# Patient Record
Sex: Male | Born: 1965 | Race: White | Hispanic: No | Marital: Married | Smoking: Never smoker
Health system: Southern US, Community
[De-identification: ages and names within clinical notes are randomized; demographics above are authoritative.]

## PROBLEM LIST (undated history)

## (undated) ENCOUNTER — Emergency Department (HOSPITAL_COMMUNITY): Admission: EM | Source: Home / Self Care

---

## 2004-11-29 ENCOUNTER — Emergency Department: Payer: Self-pay | Admitting: General Practice

## 2005-01-27 ENCOUNTER — Emergency Department: Payer: Self-pay | Admitting: Emergency Medicine

## 2007-05-26 IMAGING — CT CT STONE STUDY
1 of 2 series · 16 of 32 positions shown, 20 images · non-contrast
Comparison: none

REASON FOR EXAM: Left flank pain  rm 17
COMMENTS:

[Series 2: stone · axial · 0.72mm/px · z∈[-508,-52]mm · 16 of 164 slices shown, 20 images]
[im 6/164  soft-tissue]
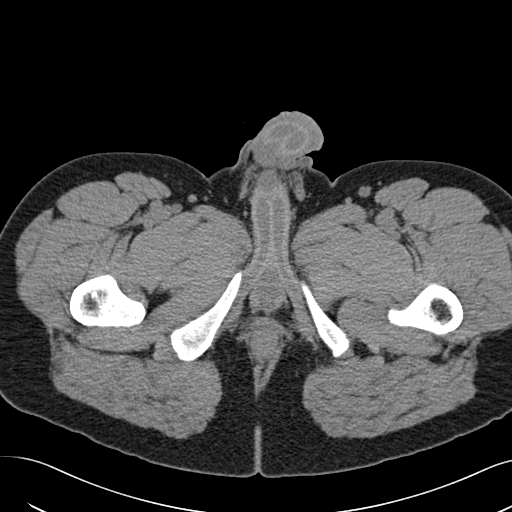
[im 6/164  bone]
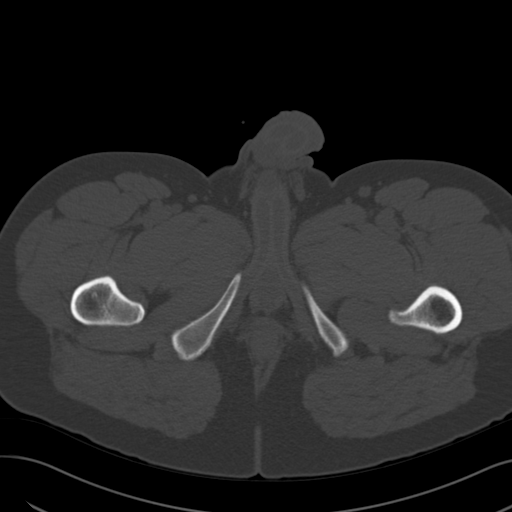
[im 18/164  soft-tissue]
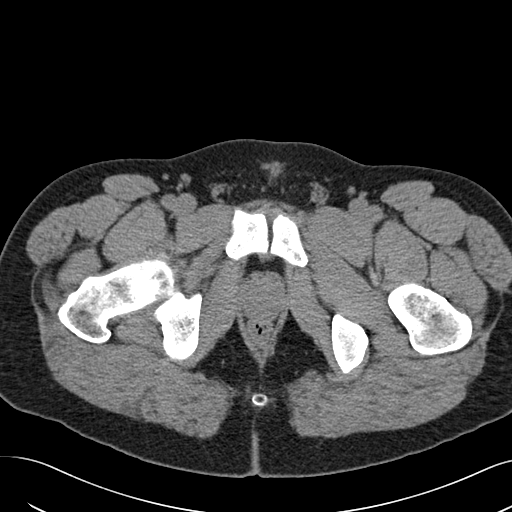
[im 30/164  soft-tissue]
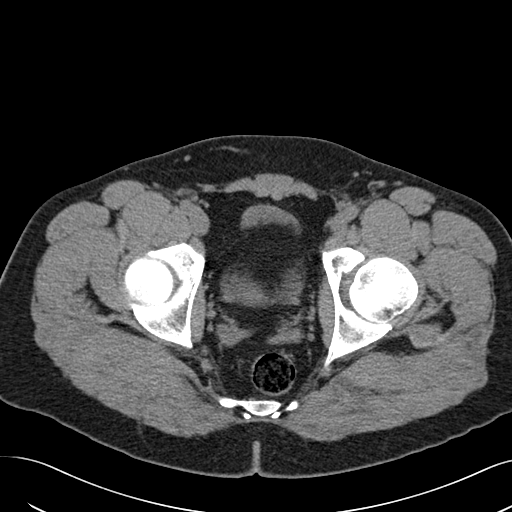
[im 41/164  soft-tissue]
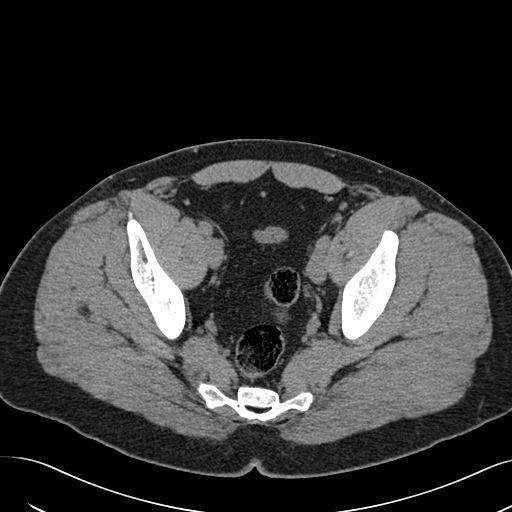
[im 53/164  soft-tissue]
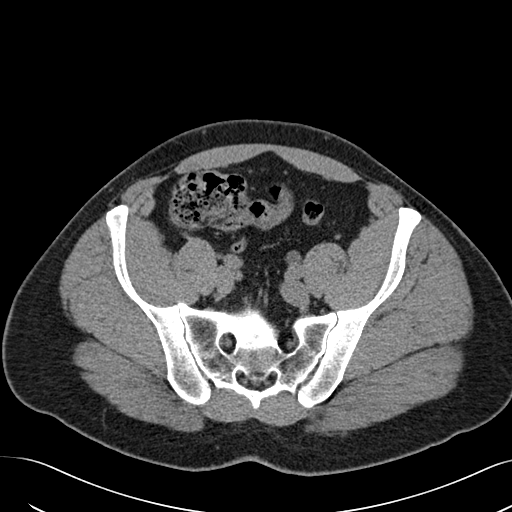
[im 65/164  soft-tissue]
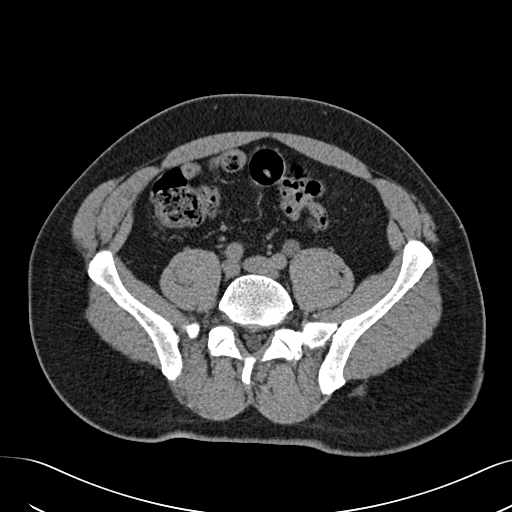
[im 76/164  soft-tissue]
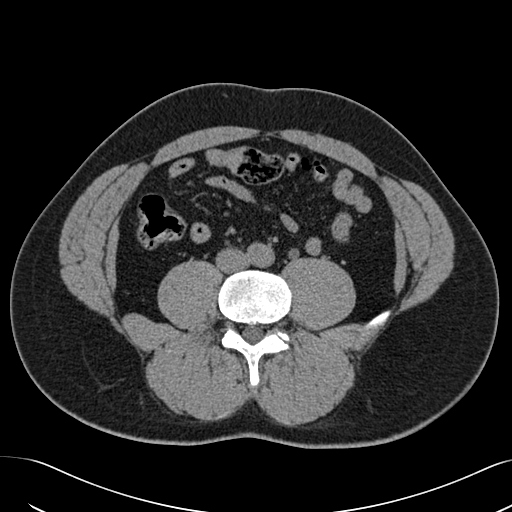
[im 88/164  soft-tissue]
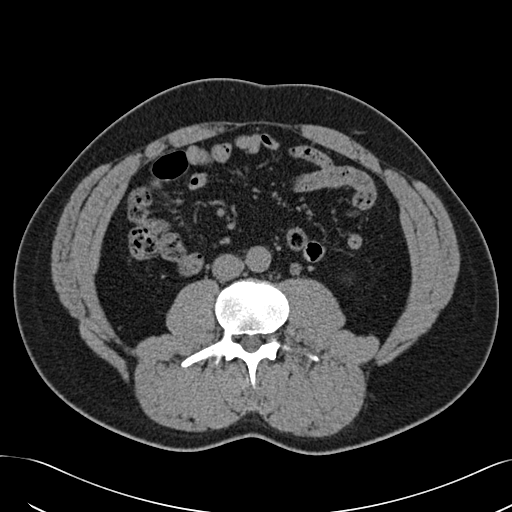
[im 99/164  soft-tissue]
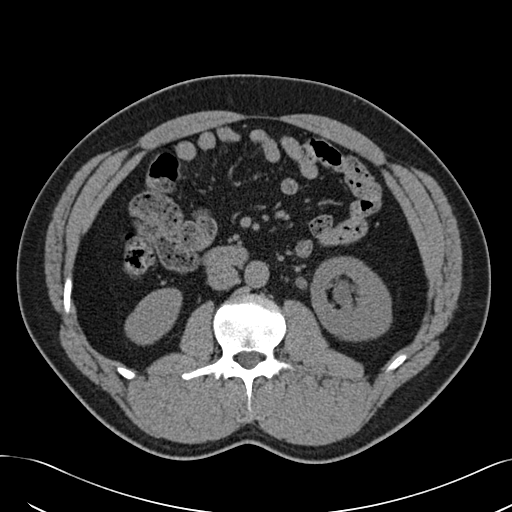
[im 99/164  bone]
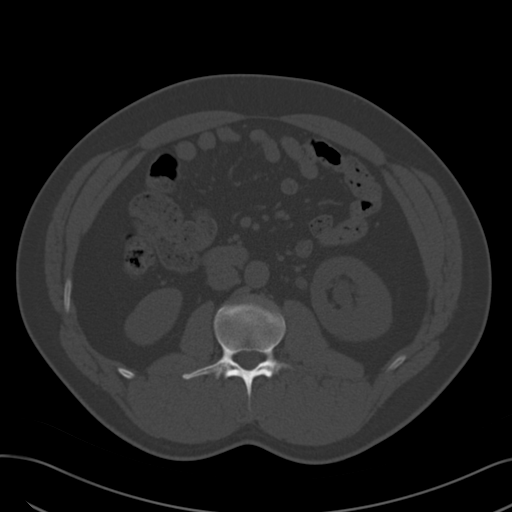
[im 111/164  soft-tissue]
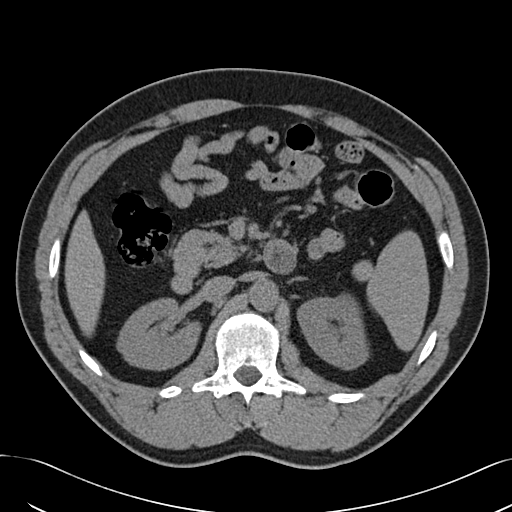
[im 123/164  soft-tissue]
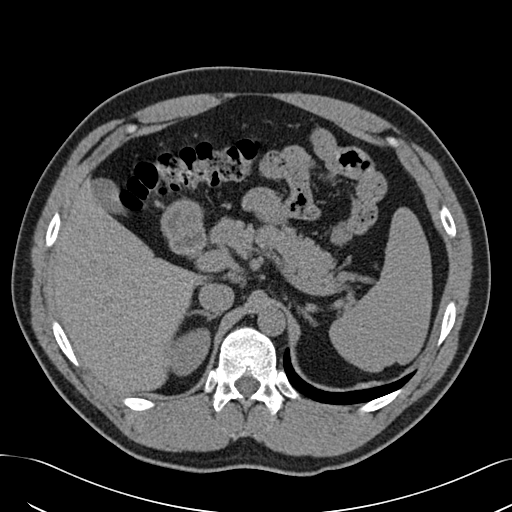
[im 134/164  soft-tissue]
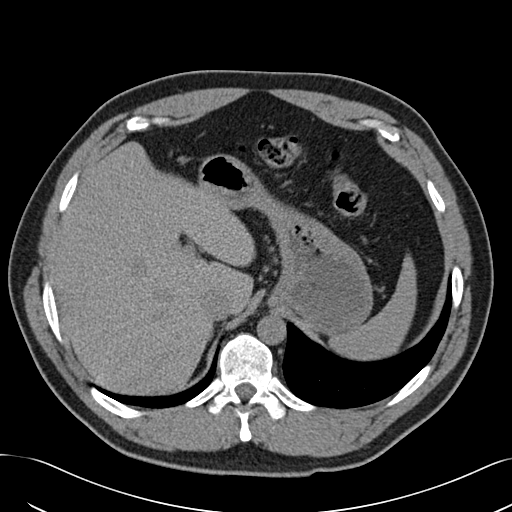
[im 140/164  lung]
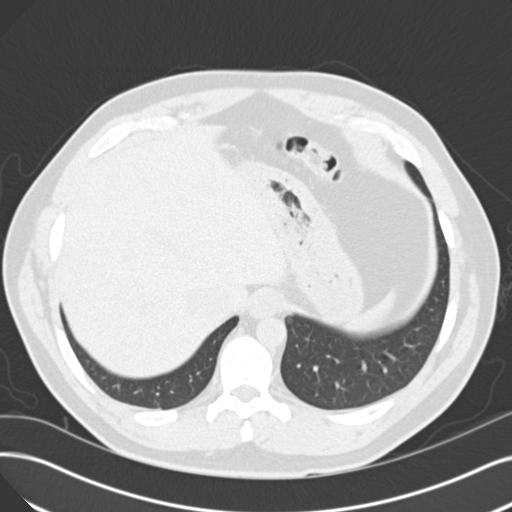
[im 146/164  soft-tissue]
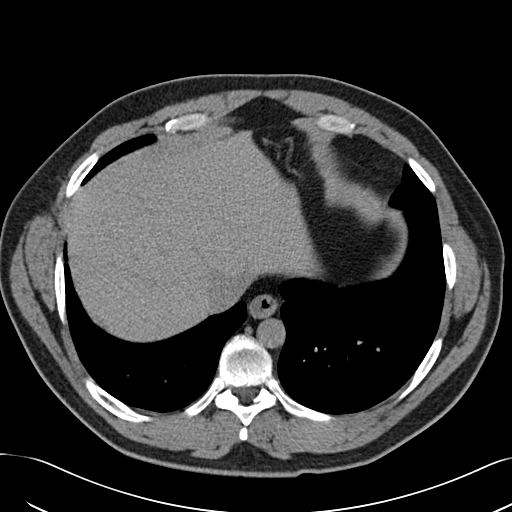
[im 146/164  lung]
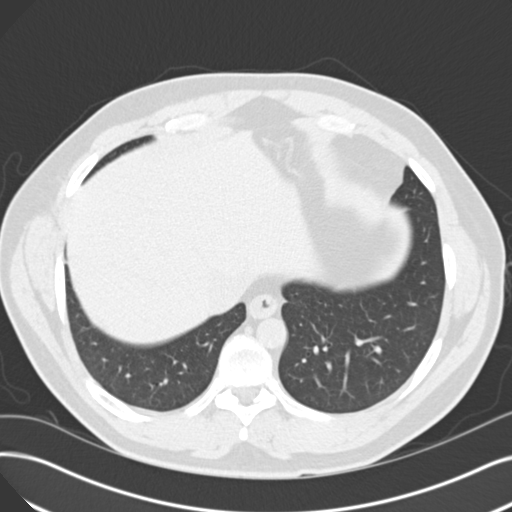
[im 152/164  lung]
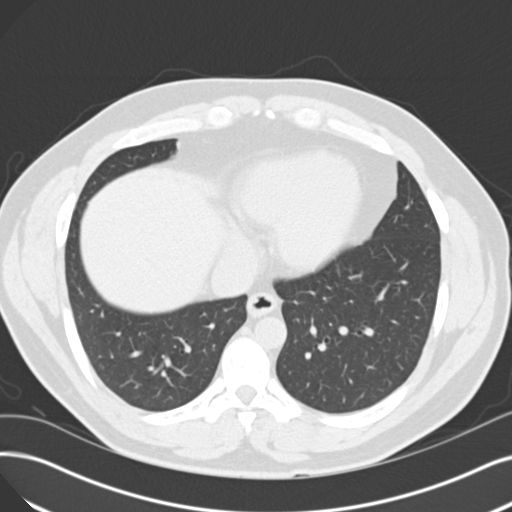
[im 158/164  soft-tissue]
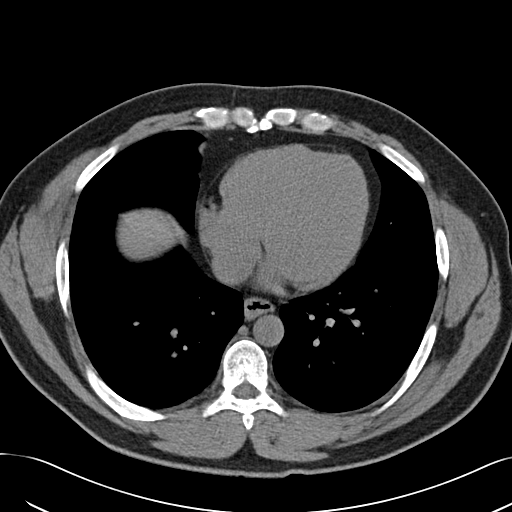
[im 158/164  lung]
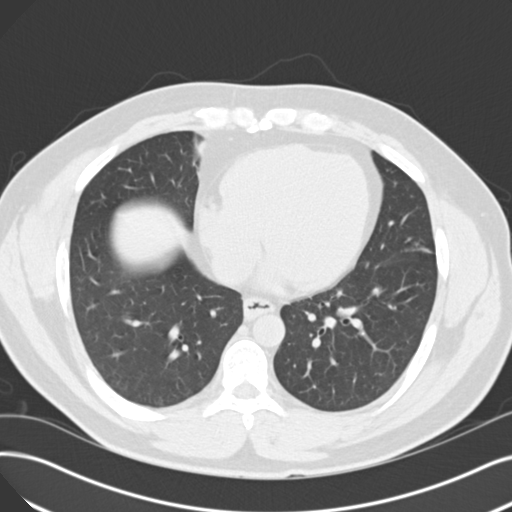

[16 of 32 positions shown; findings below may reference images not displayed]

PROCEDURE:     CT  - CT ABDOMEN /PELVIS WO (STONE)  - January 27, 2005  [DATE]

RESULT:       Unenhanced emergent CT was performed for LEFT flank pain.
Exam was originally read by the [HOSPITAL].  There is noted approximately
3 mm calculus in the distal LEFT ureter with associated dilatation of the
ureter and upper LEFT collecting system.  No renal calculi are noted in the
RIGHT or LEFT kidney. No free fluid is noted in the abdomen or pelvis.  The
images through the lung bases reveal the lung bases to be clear with no
effusion.
IMPRESSION: Findings which can be compatible with an approximately 3 mm calculus in the
distal LEFT ureter with associated mild LEFT hydronephrosis.

## 2015-04-22 ENCOUNTER — Encounter: Payer: Self-pay | Admitting: Emergency Medicine

## 2015-04-22 ENCOUNTER — Emergency Department
Admission: EM | Admit: 2015-04-22 | Discharge: 2015-04-22 | Disposition: A | Discharge status: Home / Self Care | Attending: Emergency Medicine | Admitting: Emergency Medicine

## 2015-04-22 DIAGNOSIS — Y999 Unspecified external cause status: Secondary | ICD-10-CM | POA: Diagnosis not present

## 2015-04-22 DIAGNOSIS — W268XXA Contact with other sharp object(s), not elsewhere classified, initial encounter: Secondary | ICD-10-CM | POA: Diagnosis not present

## 2015-04-22 DIAGNOSIS — Y9389 Activity, other specified: Secondary | ICD-10-CM | POA: Insufficient documentation

## 2015-04-22 DIAGNOSIS — Z23 Encounter for immunization: Secondary | ICD-10-CM | POA: Diagnosis not present

## 2015-04-22 DIAGNOSIS — S61210A Laceration without foreign body of right index finger without damage to nail, initial encounter: Secondary | ICD-10-CM | POA: Insufficient documentation

## 2015-04-22 DIAGNOSIS — Y929 Unspecified place or not applicable: Secondary | ICD-10-CM | POA: Insufficient documentation

## 2015-04-22 DIAGNOSIS — S61219A Laceration without foreign body of unspecified finger without damage to nail, initial encounter: Secondary | ICD-10-CM

## 2015-04-22 MED ORDER — LIDOCAINE HCL (PF) 1 % IJ SOLN
10.0000 mL | Freq: Once | INTRAMUSCULAR | Status: DC
  Filled 2015-04-22: qty 10

## 2015-04-22 MED ORDER — DIPHENHYDRAMINE HCL 25 MG PO CAPS
ORAL_CAPSULE | ORAL | Status: AC
  Filled 2015-04-22: qty 1

## 2015-04-22 MED ORDER — TETANUS-DIPHTH-ACELL PERTUSSIS 5-2.5-18.5 LF-MCG/0.5 IM SUSP
0.5000 mL | Freq: Once | INTRAMUSCULAR | Status: AC
  Administered 2015-04-22: 0.5 mL via INTRAMUSCULAR
  Filled 2015-04-22: qty 0.5

## 2015-04-22 NOTE — ED Notes (Signed)
Patient presents to the ED with laceration to his right index finger.  Patient states he was installing an oven fan and patient states he cut his finger with the metal casing around an oven fan.  Laceration appears to be about 1 inch long and edges are approximated.  Patient is in no obvious distress at this time.

## 2015-04-22 NOTE — Discharge Instructions (Signed)
Laceration Care, Adult °A laceration is a cut that goes through all of the layers of the skin and into the tissue that is right under the skin. Some lacerations heal on their own. Others need to be closed with stitches (sutures), staples, skin adhesive strips, or skin glue. Proper laceration care minimizes the risk of infection and helps the laceration to heal better. °HOW TO CARE FOR YOUR LACERATION °If sutures or staples were used: °· Keep the wound clean and dry. °· If you were given a bandage (dressing), you should change it at least one time per day or as told by your health care provider. You should also change it if it becomes wet or dirty. °· Keep the wound completely dry for the first 24 hours or as told by your health care provider. After that time, you may shower or bathe. However, make sure that the wound is not soaked in water until after the sutures or staples have been removed. °· Clean the wound one time each day or as told by your health care provider: °· Wash the wound with soap and water. °· Rinse the wound with water to remove all soap. °· Pat the wound dry with a clean towel. Do not rub the wound. °· After cleaning the wound, apply a thin layer of antibiotic ointment as told by your health care provider. This will help to prevent infection and keep the dressing from sticking to the wound. °· Have the sutures or staples removed as told by your health care provider. °If skin adhesive strips were used: °· Keep the wound clean and dry. °· If you were given a bandage (dressing), you should change it at least one time per day or as told by your health care provider. You should also change it if it becomes dirty or wet. °· Do not get the skin adhesive strips wet. You may shower or bathe, but be careful to keep the wound dry. °· If the wound gets wet, pat it dry with a clean towel. Do not rub the wound. °· Skin adhesive strips fall off on their own. You may trim the strips as the wound heals. Do not  remove skin adhesive strips that are still stuck to the wound. They will fall off in time. °If skin glue was used: °· Try to keep the wound dry, but you may briefly wet it in the shower or bath. Do not soak the wound in water, such as by swimming. °· After you have showered or bathed, gently pat the wound dry with a clean towel. Do not rub the wound. °· Do not do any activities that will make you sweat heavily until the skin glue has fallen off on its own. °· Do not apply liquid, cream, or ointment medicine to the wound while the skin glue is in place. Using those may loosen the film before the wound has healed. °· If you were given a bandage (dressing), you should change it at least one time per day or as told by your health care provider. You should also change it if it becomes dirty or wet. °· If a dressing is placed over the wound, be careful not to apply tape directly over the skin glue. Doing that may cause the glue to be pulled off before the wound has healed. °· Do not pick at the glue. The skin glue usually remains in place for 5-10 days, then it falls off of the skin. °General Instructions °· Take over-the-counter and prescription   medicines only as told by your health care provider. °· If you were prescribed an antibiotic medicine or ointment, take or apply it as told by your doctor. Do not stop using it even if your condition improves. °· To help prevent scarring, make sure to cover your wound with sunscreen whenever you are outside after stitches are removed, after adhesive strips are removed, or when glue remains in place and the wound is healed. Make sure to wear a sunscreen of at least 30 SPF. °· Do not scratch or pick at the wound. °· Keep all follow-up visits as told by your health care provider. This is important. °· Check your wound every day for signs of infection. Watch for: °· Redness, swelling, or pain. °· Fluid, blood, or pus. °· Raise (elevate) the injured area above the level of your heart  while you are sitting or lying down, if possible. °SEEK MEDICAL CARE IF: °· You received a tetanus shot and you have swelling, severe pain, redness, or bleeding at the injection site. °· You have a fever. °· A wound that was closed breaks open. °· You notice a bad smell coming from your wound or your dressing. °· You notice something coming out of the wound, such as wood or glass. °· Your pain is not controlled with medicine. °· You have increased redness, swelling, or pain at the site of your wound. °· You have fluid, blood, or pus coming from your wound. °· You notice a change in the color of your skin near your wound. °· You need to change the dressing frequently due to fluid, blood, or pus draining from the wound. °· You develop a new rash. °· You develop numbness around the wound. °SEEK IMMEDIATE MEDICAL CARE IF: °· You develop severe swelling around the wound. °· Your pain suddenly increases and is severe. °· You develop painful lumps near the wound or on skin that is anywhere on your body. °· You have a red streak going away from your wound. °· The wound is on your hand or foot and you cannot properly move a finger or toe. °· The wound is on your hand or foot and you notice that your fingers or toes look pale or bluish. °  °This information is not intended to replace advice given to you by your health care provider. Make sure you discuss any questions you have with your health care provider. °  °Document Released: 12/29/2004 Document Revised: 05/15/2014 Document Reviewed: 12/25/2013 °Elsevier Interactive Patient Education ©2016 [employer] ° °Stitches, Staples, or Adhesive Wound Closure °Health care providers use stitches (sutures), staples, and certain glue (skin adhesives) to hold skin together while it heals (wound closure). You may need this treatment after you have surgery or if you cut your skin accidentally. These methods help your skin to heal more quickly and make it less likely that you will have  a scar. A wound may take several months to heal completely. °The type of wound you have determines when your wound gets closed. In most cases, the wound is closed as soon as possible (primary skin closure). Sometimes, closure is delayed so the wound can be cleaned and allowed to heal naturally. This reduces the chance of infection. Delayed closure may be needed if your wound: °· Is caused by a bite. °· Happened more than 6 hours ago. °· Involves loss of skin or the tissues under the skin. °· Has dirt or debris in it that cannot be removed. °· Is infected. °WHAT   ARE THE DIFFERENT KINDS OF WOUND CLOSURES? °There are many options for wound closure. The one that your health care provider uses depends on how deep and how large your wound is. °Adhesive Glue °To use this type of glue to close a wound, your health care provider holds the edges of the wound together and paints the glue on the surface of your skin. You may need more than one layer of glue. Then the wound may be covered with a light bandage (dressing). °This type of skin closure may be used for small wounds that are not deep (superficial). Using glue for wound closure is less painful than other methods. It does not require a medicine that numbs the area (local anesthetic). This method also leaves nothing to be removed. Adhesive glue is often used for children and on facial wounds. °Adhesive glue cannot be used for wounds that are deep, uneven, or bleeding. It is not used inside of a wound.  °Adhesive Strips °These strips are made of sticky (adhesive), porous paper. They are applied across your skin edges like a regular adhesive bandage. You leave them on until they fall off. °Adhesive strips may be used to close very superficial wounds. They may also be used along with sutures to improve the closure of your skin edges.  °Sutures °Sutures are the oldest method of wound closure. Sutures can be made from natural substances, such as silk, or from synthetic  materials, such as nylon and steel. They can be made from a material that your body can break down as your wound heals (absorbable), or they can be made from a material that needs to be removed from your skin (nonabsorbable). They come in many different strengths and sizes. °Your health care provider attaches the sutures to a steel needle on one end. Sutures can be passed through your skin, or through the tissues beneath your skin. Then they are tied and cut. Your skin edges may be closed in one continuous stitch or in separate stitches. °Sutures are strong and can be used for all kinds of wounds. Absorbable sutures may be used to close tissues under the skin. The disadvantage of sutures is that they may cause skin reactions that lead to infection. Nonabsorbable sutures need to be removed. °Staples °When surgical staples are used to close a wound, the edges of your skin on both sides of the wound are brought close together. A staple is placed across the wound, and an instrument secures the edges together. Staples are often used to close surgical cuts (incisions). °Staples are faster to use than sutures, and they cause less skin reaction. Staples need to be removed using a tool that bends the staples away from your skin. °HOW DO I CARE FOR MY WOUND CLOSURE? °· Take medicines only as directed by your health care provider. °· If you were prescribed an antibiotic medicine for your wound, finish it all even if you start to feel better. °· Use ointments or creams only as directed by your health care provider. °· Wash your hands with soap and water before and after touching your wound. °· Do not soak your wound in water. Do not take baths, swim, or use a hot tub until your health care provider approves. °· Ask your health care provider when you can start showering. Cover your wound if directed by your health care provider. °· Do not take out your own sutures or staples. °· Do not pick at your wound. Picking can cause an  infection. °·   Keep all follow-up visits as directed by your health care provider. This is important. °HOW LONG WILL I HAVE MY WOUND CLOSURE? °· Leave adhesive glue on your skin until the glue peels away. °· Leave adhesive strips on your skin until the strips fall off. °· Absorbable sutures will dissolve within several days. °· Nonabsorbable sutures and staples must be removed. The location of the wound will determine how long they stay in. This can range from several days to a couple of weeks. °WHEN SHOULD I SEEK HELP FOR MY WOUND CLOSURE? °Contact your health care provider if: °· You have a fever. °· You have chills. °· You have drainage, redness, swelling, or pain at your wound. °· There is a bad smell coming from your wound. °· The skin edges of your wound start to separate after your sutures have been removed. °· Your wound becomes thick, raised, and darker in color after your sutures come out (scarring). °  °This information is not intended to replace advice given to you by your health care provider. Make sure you discuss any questions you have with your health care provider. °  °Document Released: 09/23/2000 Document Revised: 01/19/2014 Document Reviewed: 06/07/2013 °Elsevier Interactive Patient Education ©2016 [employer] ° °

## 2015-04-22 NOTE — ED Notes (Signed)
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 Slipped and cut his right index finger

## 2015-04-22 NOTE — ED Notes (Signed)
AAOx3.  Skin warm and dry.  NAD 

## 2015-04-22 NOTE — ED Provider Notes (Signed)
[facility] Emergency Department Provider Note  ____________________________________________  Time seen: Approximately 6:59 PM  I have reviewed the triage vital signs and the nursing notes.   HISTORY  Chief Complaint Extremity Laceration    HPI Lucas Baird is a 50 y.o. male who presents emergency department complaining of a laceration to the second digit of his right hand. Patient states that he was installing a fan into the oven when he accidentally cut his finger on the head to the blade. Patient states that he has bleeding controlled with direct pressure. 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 Patient denies any numbness or tingling distal to his injury site.   History reviewed. No pertinent past medical history.  There are no active problems to display for this patient.   History reviewed. No pertinent past surgical history.  No current outpatient prescriptions on file.  Allergies Review of patient's allergies indicates no known allergies.  No family history on file.  Social History Social History  Substance Use Topics  . Smoking status: Never Smoker   . Smokeless tobacco: None  . Alcohol Use: No     Review of Systems  Musculoskeletal: No finger or hand pain to the right upper extremity. Skin: Negative for rash. Positive for laceration to the second digit right hand. Neurological: Negative for headaches, focal weakness or numbness. 10-point ROS otherwise negative.  ____________________________________________   PHYSICAL EXAM:  VITAL SIGNS: ED Triage Vitals  Enc Vitals Group     BP 04/22/15 1834 136/83 mmHg     Pulse Rate 04/22/15 1834 71     Resp 04/22/15 1834 20     Temp 04/22/15 1834 98.5 F (36.9 C)     Temp Source 04/22/15 1834 Oral     SpO2 04/22/15 1834 98 %     Weight 04/22/15 1824 204 lb (92.534 kg)     Height  04/22/15 1824  (1.727 m)     Head Cir --      Peak Flow --      Pain Score 04/22/15 1824 5     Pain Loc --      Pain Edu? --      Excl. in GC? --      Constitutional: Alert and oriented. Well appearing and in no acute distress. Cardiovascular: Normal rate, regular rhythm. Normal S1 and S2.  Good peripheral circulation. Respiratory: Normal respiratory effort without tachypnea or retractions. Lungs CTAB. Musculoskeletal:  Full range of motion to right hand and all digits right hand. Cap refill intact 5 digits. Sensation intact 5 digits. Neurologic:  Normal speech and language. No gross focal neurologic deficits are appreciated.  Skin:  Skin is warm, dry and intact. No rash noted. 3 cm laceration noted to the second digit right hand. No bleeding at this time. Edges are smooth. No visible foreign body. Psychiatric: Mood and affect are normal. Speech and behavior are normal. Patient exhibits appropriate insight and judgement.   ____________________________________________   LABS (all labs ordered are listed, but only abnormal results are displayed)  Labs Reviewed - No data to display ____________________________________________  EKG   ____________________________________________  RADIOLOGY   No results found.  ____________________________________________    PROCEDURES  Procedure(s) performed:   LACERATION REPAIR Performed by: Racheal Patches Authorized by: Delorise Royals Torris House Consent: Verbal consent obtained. Risks and benefits: risks, benefits and alternatives were discussed Consent given  by: patient Patient identity confirmed: provided demographic data Prepped and Draped in normal sterile fashion Wound explored  Laceration Location: Second digit right hand  Laceration Length: 3 cm  No Foreign Bodies seen or palpated  Anesthesia: Digital block   Local anesthetic: lidocaine 1 % without epinephrine  Anesthetic total: 5 ml  Irrigation method:  syringe Amount of cleaning: standard  Skin closure: 4-0 Ethilon sutures   Number of sutures: 6   Technique: Simple interrupted   Patient tolerance: Patient tolerated the procedure well with no immediate complications.     Medications  lidocaine (PF) (XYLOCAINE) 1 % injection 10 mL (not administered)  Tdap (BOOSTRIX) injection 0.5 mL (0.5 mLs Intramuscular Given 04/22/15 1910)     ____________________________________________   INITIAL IMPRESSION / ASSESSMENT AND PLAN / ED COURSE  Pertinent labs & imaging results that were available during my care of the patient were reviewed by me and considered in my medical decision making (see chart for details).  Patient's diagnosis is consistent with finger laceration to the second digit right hand. This is closed as described above..  [place]. Patient is given ED precautions to return to the ED for any worsening or new symptoms.     ____________________________________________  FINAL CLINICAL IMPRESSION(S) / ED DIAGNOSES  Final diagnoses:  Finger laceration, initial encounter      NEW MEDICATIONS STARTED DURING THIS VISIT:  New Prescriptions   No medications on file        This chart was dictated using voice recognition software/Dragon. Despite best efforts to proofread, errors can occur which can change the meaning. Any change was purely unintentional.    Racheal PatchesJonathan D Stoy Fenn, PA-C 04/22/15 1934  Phineas SemenGraydon Goodman, MD 04/22/15 2044

## 2017-07-26 ENCOUNTER — Encounter: Payer: Self-pay | Admitting: Emergency Medicine

## 2017-07-26 ENCOUNTER — Emergency Department
Admission: EM | Admit: 2017-07-26 | Discharge: 2017-07-26 | Disposition: A | Discharge status: Home / Self Care | Attending: Emergency Medicine | Admitting: Emergency Medicine

## 2017-07-26 ENCOUNTER — Other Ambulatory Visit: Payer: Self-pay

## 2017-07-26 ENCOUNTER — Emergency Department

## 2017-07-26 DIAGNOSIS — K529 Noninfective gastroenteritis and colitis, unspecified: Secondary | ICD-10-CM | POA: Diagnosis not present

## 2017-07-26 DIAGNOSIS — R112 Nausea with vomiting, unspecified: Secondary | ICD-10-CM | POA: Diagnosis present

## 2017-07-26 LAB — CBC
HCT: 44.9 % (ref 40.0–52.0)
Hemoglobin: 15.4 g/dL (ref 13.0–18.0)
MCH: 30.2 pg (ref 26.0–34.0)
MCHC: 34.4 g/dL (ref 32.0–36.0)
MCV: 87.8 fL (ref 80.0–100.0)
PLATELETS: 163 10*3/uL (ref 150–440)
RBC: 5.11 MIL/uL (ref 4.40–5.90)
RDW: 13.7 % (ref 11.5–14.5)
WBC: 7.2 10*3/uL (ref 3.8–10.6)

## 2017-07-26 LAB — BASIC METABOLIC PANEL
Anion gap: 10 (ref 5–15)
BUN: 14 mg/dL (ref 6–20)
CALCIUM: 9.1 mg/dL (ref 8.9–10.3)
CHLORIDE: 104 mmol/L (ref 98–111)
CO2: 24 mmol/L (ref 22–32)
CREATININE: 1.15 mg/dL (ref 0.61–1.24)
GFR calc Af Amer: >60 mL/min (ref >60)
GFR calc non Af Amer: >60 mL/min (ref >60)
GLUCOSE: 96 mg/dL (ref 70–99)
Potassium: 4.2 mmol/L (ref 3.5–5.1)
Sodium: 138 mmol/L (ref 135–145)

## 2017-07-26 LAB — LIPASE, BLOOD: LIPASE: 32 U/L (ref 11–51)

## 2017-07-26 MED ORDER — SODIUM CHLORIDE 0.9 % IV SOLN
Freq: Once | INTRAVENOUS | Status: AC
  Administered 2017-07-26: 12:00:00 via INTRAVENOUS

## 2017-07-26 MED ORDER — FAMOTIDINE 20 MG PO TABS: 20.0000 mg | ORAL_TABLET | Freq: Two times a day (BID) | ORAL | 0 refills | Status: AC

## 2017-07-26 MED ORDER — PROMETHAZINE HCL 25 MG/ML IJ SOLN
INTRAMUSCULAR | Status: AC
  Filled 2017-07-26: qty 1

## 2017-07-26 MED ORDER — FAMOTIDINE IN NACL 20-0.9 MG/50ML-% IV SOLN
INTRAVENOUS | Status: AC
  Filled 2017-07-26: qty 50

## 2017-07-26 MED ORDER — ONDANSETRON HCL 4 MG/2ML IJ SOLN
4.0000 mg | Freq: Once | INTRAMUSCULAR | Status: AC
  Administered 2017-07-26: 4 mg via INTRAVENOUS

## 2017-07-26 MED ORDER — PROMETHAZINE HCL 25 MG/ML IJ SOLN
12.5000 mg | Freq: Once | INTRAMUSCULAR | Status: AC
  Administered 2017-07-26: 12.5 mg via INTRAVENOUS

## 2017-07-26 MED ORDER — ONDANSETRON 4 MG PO TBDP: 4.0000 mg | ORAL_TABLET | Freq: Three times a day (TID) | ORAL | 0 refills | Status: AC | PRN

## 2017-07-26 MED ORDER — ONDANSETRON HCL 4 MG/2ML IJ SOLN
4.0000 mg | Freq: Once | INTRAMUSCULAR | Status: AC
  Administered 2017-07-26: 4 mg via INTRAVENOUS
  Filled 2017-07-26: qty 2

## 2017-07-26 MED ORDER — FAMOTIDINE IN NACL 20-0.9 MG/50ML-% IV SOLN
20.0000 mg | Freq: Once | INTRAVENOUS | Status: AC
  Administered 2017-07-26: 20 mg via INTRAVENOUS
  Filled 2017-07-26: qty 50

## 2017-07-26 MED ORDER — ONDANSETRON HCL 4 MG/2ML IJ SOLN
INTRAMUSCULAR | Status: AC
  Administered 2017-07-26: 4 mg via INTRAVENOUS
  Filled 2017-07-26: qty 2

## 2017-07-26 MED ORDER — DICYCLOMINE HCL 20 MG PO TABS: 20.0000 mg | ORAL_TABLET | Freq: Three times a day (TID) | ORAL | 0 refills | Status: AC | PRN

## 2017-07-26 NOTE — ED Notes (Signed)
Pt presents with n/v/d since Saturday. States he has become dizzy and is unable to keep down food or liquids. Pt alert & oriented with NAD noted.

## 2017-07-26 NOTE — ED Provider Notes (Signed)
Clinical Course as of Jul 27 1811  Mon Jul 26, 2017  1535 Patient reassessed.  States that his pain and nausea have improved "a little" but he is still having persistent symptoms.  Abdomen reexamined, does have some mild right upper quadrant tenderness without Murphy sign.  No tenderness at McBurney's point, no peritoneal signs.  I will obtain an ultrasound of the right upper quadrant to look for gallstones, Phenergan and Pepcid for ongoing symptom relief.   [PS]    Clinical Course User Index [PS] Sharman CheekStafford, Arleta Ostrum, MD      ----------------------------------------- 6:13 PM on 07/26/2017 -----------------------------------------  History feeling better still.  Ultrasound unremarkable.  Stable for discharge home at this time.  Abdominal exam remains essentially benign with mild right upper quadrant tenderness.  Doubt perforation obstruction or other acute surgical pathology.  Doubt acalculous cholecystitis  Final diagnoses:  Gastroenteritis      Sharman CheekStafford, Miles Borkowski, MD 07/26/17 [phone]

## 2017-07-26 NOTE — ED Provider Notes (Signed)
[facility] Emergency Department Provider Note   ____________________________________________    I have reviewed the triage vital signs and the nursing notes.   HISTORY  Chief Complaint Emesis     HPI Lucas Baird is a 52 y.o. male who presents with nausea vomiting and abdominal cramping over the last 24 hours.  Patient reports he woke up yesterday morning with nausea and vomiting, no hematemesis, has not taken anything for this.  Some abdominal cramping diffusely, occasionally worse in the right lower quadrant.  Is having stools that are normal.  No fevers or chills.  No myalgias.  No recent travel.  No camping.  No sick contacts reported   History reviewed. No pertinent past medical history.  There are no active problems to display for this patient.   No past surgical history on file.  Prior to Admission medications   Not on File     Allergies Patient has no known allergies.  No family history on file.  Social History Social History   Tobacco Use  . Smoking status: Never Smoker  . Smokeless tobacco: Never Used  Substance Use Topics  . Alcohol use: No  . Drug use: Not on file    Review of Systems  Constitutional: No fever/chills Eyes: No visual changes.  ENT: No sore throat. Cardiovascular: Denies chest pain. Respiratory: Denies shortness of breath. Gastrointestinal: As above .  Genitourinary: Negative for dysuria. Musculoskeletal: Negative for back pain. Skin: Negative for rash. Neurological: Negative for headaches or weakness   ____________________________________________   PHYSICAL EXAM:  VITAL SIGNS: ED Triage Vitals [07/26/17 1203]  Enc Vitals Group     BP 137/88     Pulse Rate 63     Resp 16     Temp 98.5 F (36.9 C)     Temp Source Oral     SpO2 97 %     Weight 98.4 kg (217 lb)     Height 1.727 m (5\' 8" )     Head Circumference      Peak Flow      Pain Score 3     Pain Loc      Pain Edu?    Excl. in GC?     Constitutional: Alert and oriented. No acute distress.  Eyes: Conjunctivae are normal.   Nose: No congestion/rhinnorhea. Mouth/Throat: Mucous membranes are moist.    Cardiovascular: Normal rate, regular rhythm. Grossly normal heart sounds.  Good peripheral circulation. Respiratory: Normal respiratory effort.  No retractions. Lungs CTAB. Gastrointestinal: Soft and nontender. No distention.  No CVA tenderness. Genitourinary: deferred Musculoskeletal: No lower extremity tenderness nor edema.  Warm and well perfused Neurologic:  Normal speech and language. No gross focal neurologic deficits are appreciated.  Skin:  Skin is warm, dry and intact. No rash noted. Psychiatric: Mood and affect are normal. Speech and behavior are normal.  ____________________________________________   LABS (all labs ordered are listed, but only abnormal results are displayed)  Labs Reviewed  BASIC METABOLIC PANEL  CBC  LIPASE, BLOOD   ____________________________________________  EKG  ED ECG REPORT I, Jene Everyobert Simaya Lumadue, the attending physician, personally viewed and interpreted this ECG.  Date: 07/26/2017  Rhythm: normal sinus rhythm QRS Axis: normal Intervals: normal ST/T Wave abnormalities: normal Narrative Interpretation: no evidence of acute ischemia  ____________________________________________  RADIOLOGY  None ____________________________________________   PROCEDURES  Procedure(s) performed: No  Procedures   Critical Care performed: No ____________________________________________   INITIAL IMPRESSION / ASSESSMENT AND PLAN / ED COURSE  Pertinent  labs & imaging results that were available during my care of the patient were reviewed by me and considered in my medical decision making (see chart for details).  Patient well-appearing in no acute distress, abdominal exam is quite reassuring.  No significant tenderness to palpation.  Lab work is unremarkable.  HPI is  most consistent with likely gastritis/gastroenteritis, probably viral which is common in the community at this time.  Will treat with IV fluids, IV Zofran and reevaluate.    ____________________________________________   FINAL CLINICAL IMPRESSION(S) / ED DIAGNOSES  Final diagnoses:  Gastroenteritis        Note:  This document was prepared using Dragon voice recognition software and may include unintentional dictation errors.    Jene Every, MD 07/26/17 [phone]

## 2017-07-26 NOTE — ED Triage Notes (Signed)
Pt reports vomiting, diarrhea and weakness/dizziness since Saturday. Pt denies any abdominal pain.

## 2020-02-13 IMAGING — US US ABDOMEN LIMITED
1 series · 14 of 25 positions shown · non-contrast
Comparison: CT abdomen and pelvis 01/27/2005

CLINICAL DATA: RIGHT upper quadrant pain, nausea, and vomiting
since 07/24/2017

EXAM:
ULTRASOUND ABDOMEN LIMITED RIGHT UPPER QUADRANT

[Series 1: us abdomen limited · 0.26mm/px · 14 of 37 slices shown]
[im 1/37]
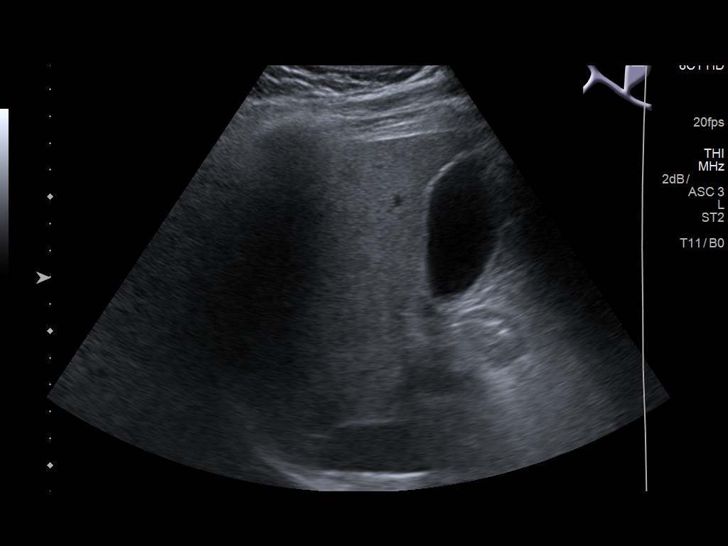
[im 4/37]
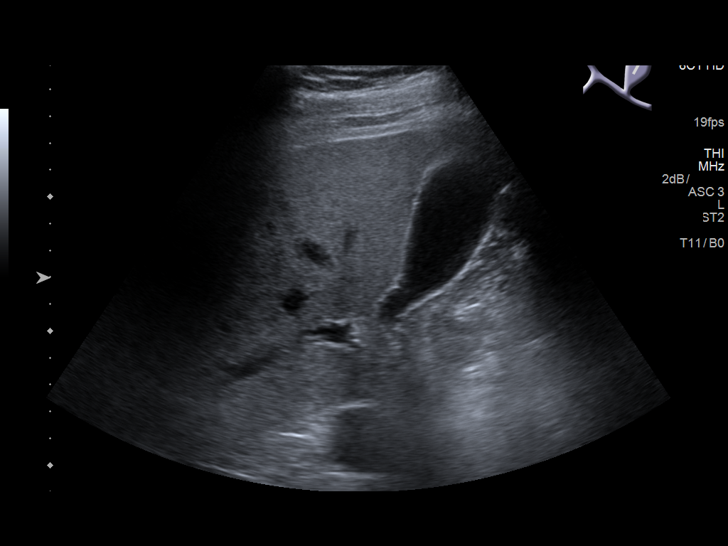
[im 7/37]
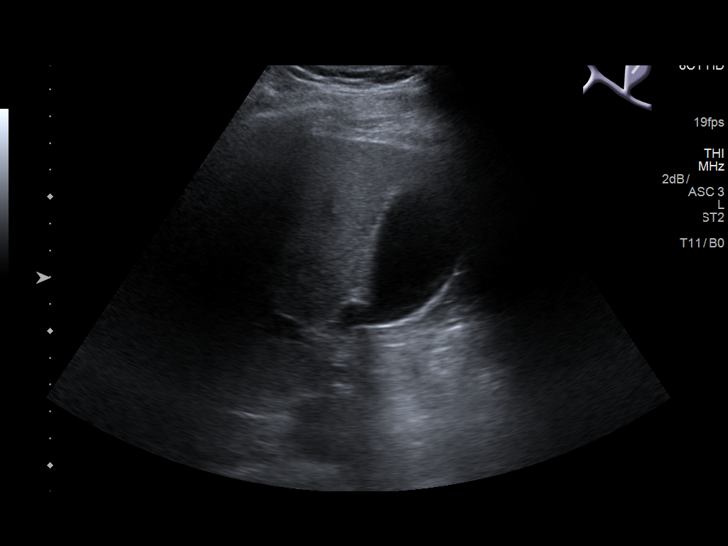
[im 10/37]
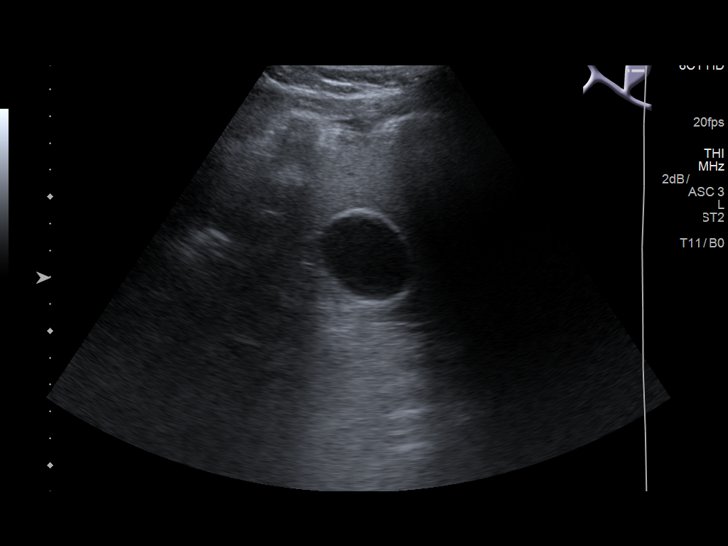
[im 13/37]
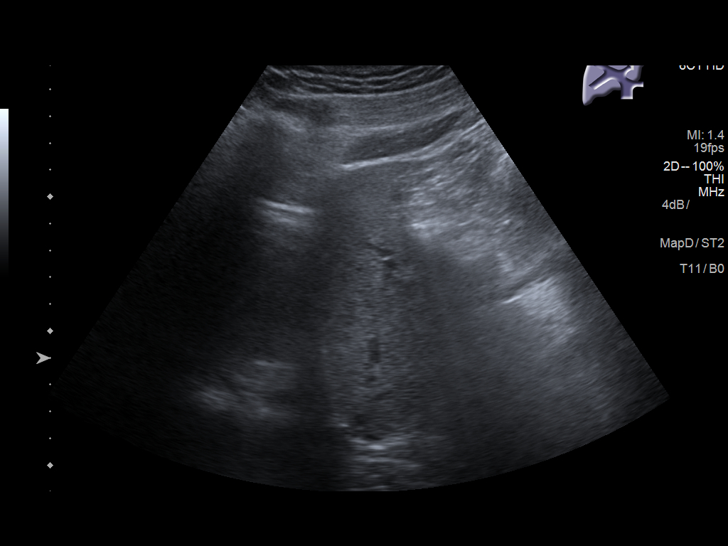
[im 14/37]
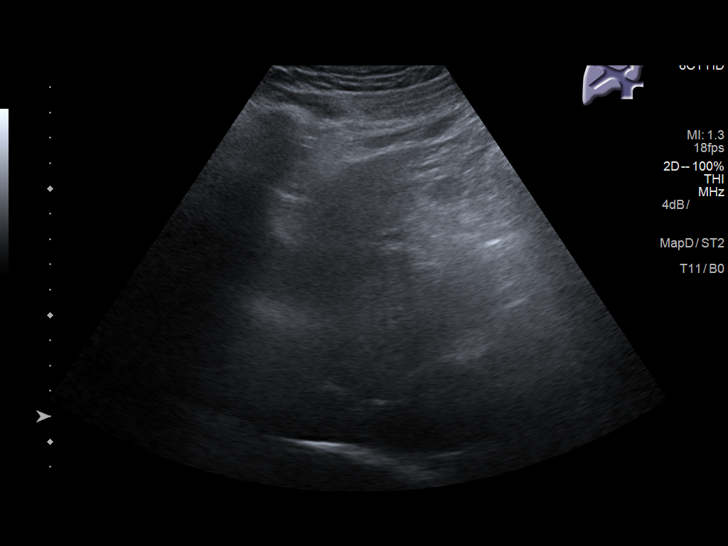
[im 17/37]
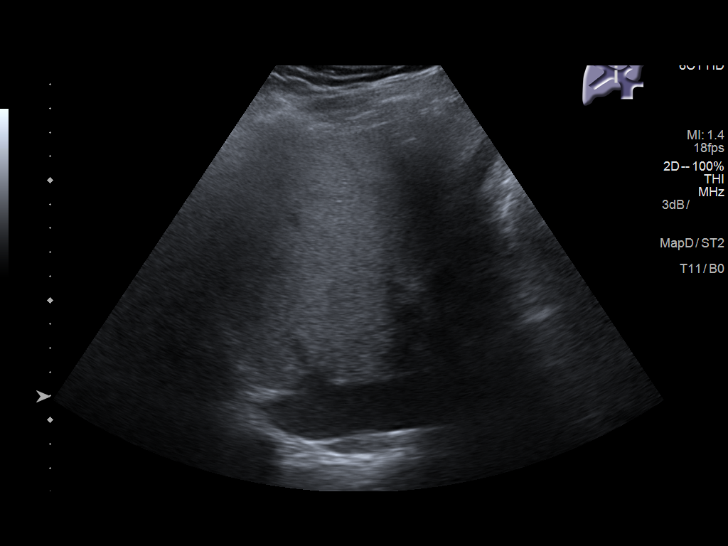
[im 20/37]
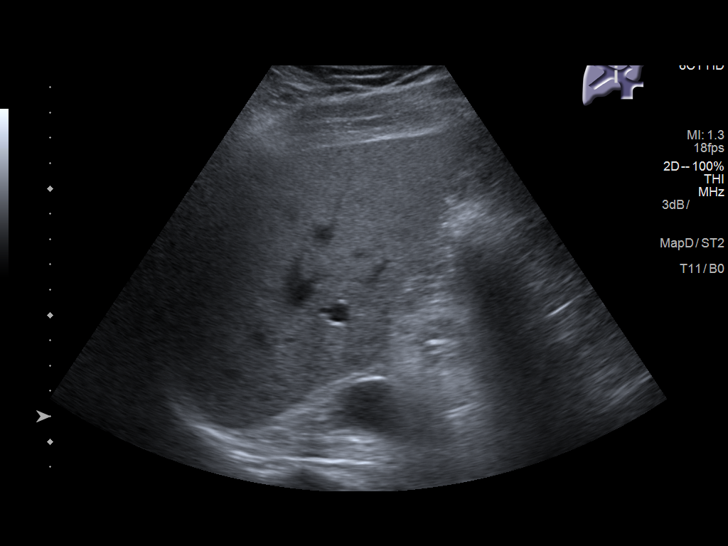
[im 23/37]
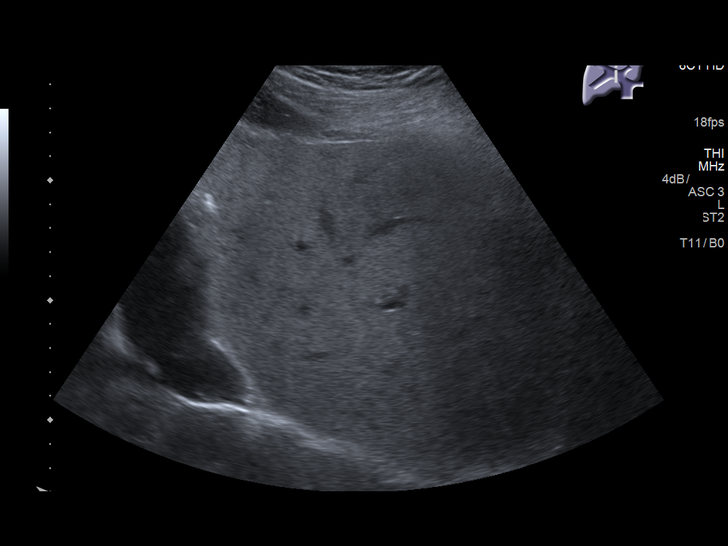
[im 25/37]
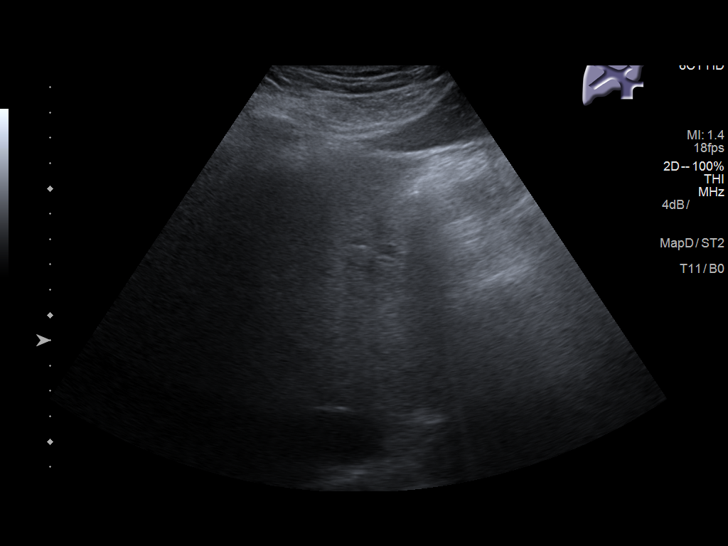
[im 28/37]
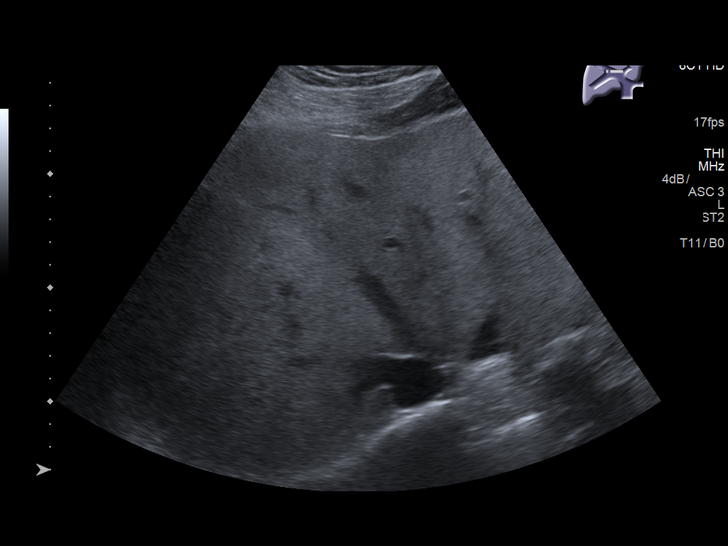
[im 31/37]
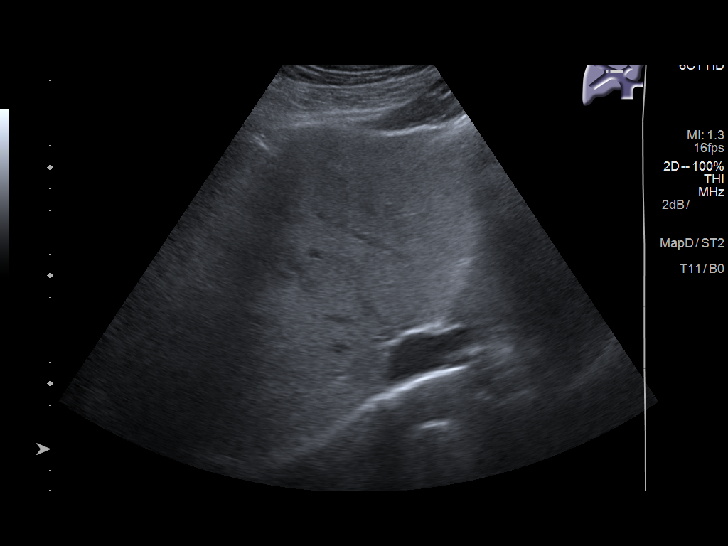
[im 34/37]
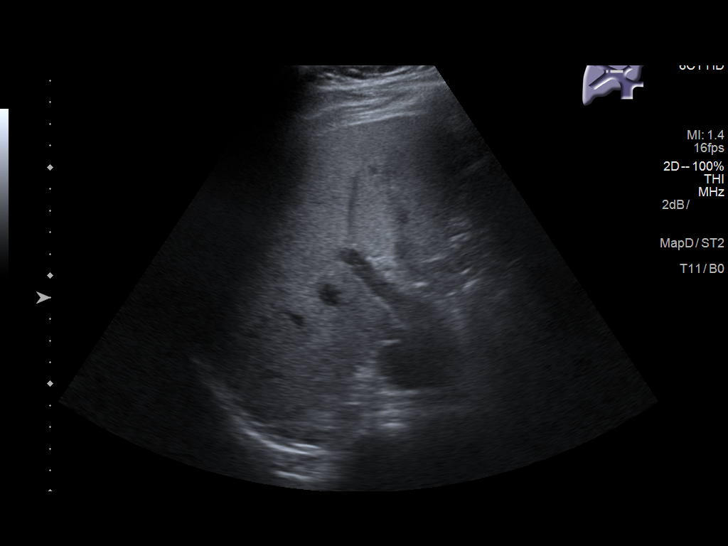
[im 37/37]
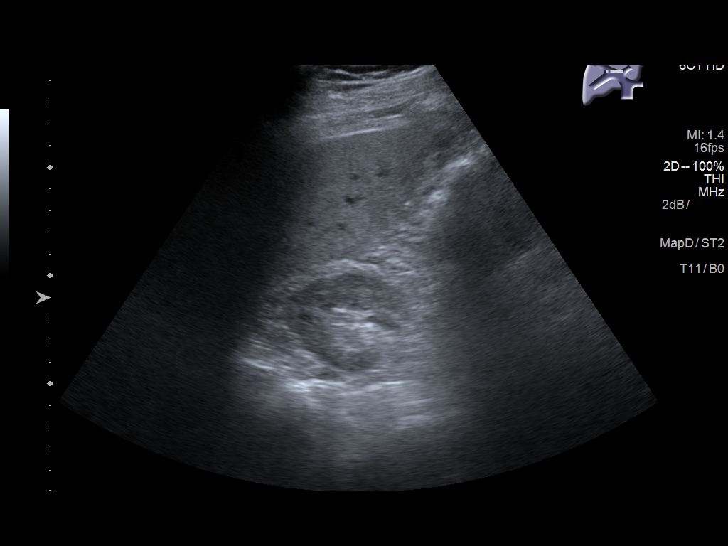

[14 of 25 positions shown; findings below may reference images not displayed]

FINDINGS: Gallbladder:

Normally distended without stones or wall thickening. No
pericholecystic fluid or sonographic Murphy sign.

Common bile duct:

Diameter: Normal caliber 2 mm diameter

Liver:

Echogenic parenchyma, likely fatty infiltration though this can be
seen with cirrhosis and certain infiltrative disorders. No focal
hepatic mass or nodularity. Portal vein is patent on color Doppler
imaging with normal direction of blood flow towards the liver.

No RIGHT upper quadrant free fluid
IMPRESSION: Probable fatty infiltration of liver.

Otherwise negative exam.
